# Patient Record
Sex: Female | Born: 1975 | Race: Asian | Hispanic: No | Marital: Married | Smoking: Never smoker
Health system: Southern US, Community
[De-identification: ages and names within clinical notes are randomized; demographics above are authoritative.]

## PROBLEM LIST (undated history)

## (undated) DIAGNOSIS — E119 Type 2 diabetes mellitus without complications: Secondary | ICD-10-CM

## (undated) HISTORY — DX: Type 2 diabetes mellitus without complications: E11.9

---

## 2016-12-15 ENCOUNTER — Encounter (INDEPENDENT_AMBULATORY_CARE_PROVIDER_SITE_OTHER): Payer: Self-pay | Admitting: Orthopaedic Surgery

## 2016-12-15 ENCOUNTER — Ambulatory Visit (INDEPENDENT_AMBULATORY_CARE_PROVIDER_SITE_OTHER): Payer: BLUE CROSS/BLUE SHIELD | Admitting: Orthopaedic Surgery

## 2016-12-15 VITALS — BP 135/85 | HR 111 | Ht 62.0 in | Wt 140.0 lb

## 2016-12-15 DIAGNOSIS — G8929 Other chronic pain: Secondary | ICD-10-CM | POA: Diagnosis not present

## 2016-12-15 DIAGNOSIS — M25561 Pain in right knee: Secondary | ICD-10-CM

## 2016-12-15 NOTE — Progress Notes (Signed)
Office Visit Note   Patient: Donna Gutierrez           Date of Birth: 1976/03/10           MRN: [id number] Visit Date: 12/15/2016              Requested by: No referring provider defined for this encounter. PCP: Karle PlumberArvind, Moogali M, MD   Assessment & Plan: Visit Diagnoses:  1. Chronic pain of right knee   Pain, chronic effusion right knee without history of injury or trauma. [facility] [place] were apparently negative  Plan: Continue Celebrex, MRI scan right knee. Long discussion regarding diagnostic possibilities. This certainly could be some internal derangement i.e. medial meniscal tear. She could have some medial compartment arthritis. She's had symptoms now for over 5 months with poor response to Celebrex  Follow-Up Instructions: Return after MRI right knee.   Orders:  Orders Placed This Encounter  Procedures  . MR Knee Right w/o contrast   No orders of the defined types were placed in this encounter.     Procedures: No procedures performed   Clinical Data: No additional findings.   Subjective: Chief Complaint  Patient presents with  . Right Knee - Pain, Edema    Donna Gutierrez is a 41 y o that presents with R knee pain x 5 months. Denies injury, Pt went to PCP and xr obtained. Celebrex helps, less ROM  Donna Gutierrez is accompanied by her cousin who speaks [place] and interprets. She has had pain and swelling in her right knee for at least 5 months. There is no history of injury or trauma. No history of skin rashes or other joint complaints. Denies fever or chills. She [facility] she [facility] and was placed on Celebrex. After approximately a week and a half there's been no change in her symptoms. She also had x-rays that were apparently negative according to the patient  HPI  Review of Systems   Objective: Vital Signs: BP 135/85   Pulse (!) 111   Ht 5\' 2"  (1.575 m)   Wt 140 lb  (63.5 kg)   BMI 25.61 kg/m   Physical Exam  Ortho Exam right knee with small effusion. The knee wasn't warm. No skin change. Full extension. Flexion was a little limited to about 105 based on the effusion. Mild diffuse medial joint pain no. No popping or clicking. No lateral joint pain. No patella popping or clicking. No opening with a varus or valgus stress. No popliteal pain. No calf pain.  Specialty Comments:  No specialty comments available.  Imaging: No results found.   PMFS History: There are no active problems to display for this patient.  Past Medical History:  Diagnosis Date  . Diabetes mellitus without complication (HCC)     History reviewed. No pertinent family history.  History reviewed. No pertinent surgical history. Social History   Occupational History  . Not on file.   Social History Main Topics  . Smoking status: Never Smoker  . Smokeless tobacco: Never Used  . Alcohol use No  . Drug use: Unknown  . Sexual activity: Not on file     Valeria BatmanPeter W Ronson Hagins, MD   Note - This record has been created using [employer] software.  Chart creation errors have been sought, but may not always  have been located. Such creation errors do not reflect on  the standard of medical care.

## 2016-12-31 ENCOUNTER — Ambulatory Visit
Admission: RE | Admit: 2016-12-31 | Discharge: 2016-12-31 | Disposition: A | Discharge status: Home / Self Care | Payer: BLUE CROSS/BLUE SHIELD | Source: Ambulatory Visit | Attending: Orthopaedic Surgery | Admitting: Orthopaedic Surgery

## 2016-12-31 DIAGNOSIS — M25561 Pain in right knee: Principal | ICD-10-CM

## 2016-12-31 DIAGNOSIS — G8929 Other chronic pain: Secondary | ICD-10-CM

## 2017-01-23 ENCOUNTER — Ambulatory Visit (INDEPENDENT_AMBULATORY_CARE_PROVIDER_SITE_OTHER): Payer: BLUE CROSS/BLUE SHIELD | Admitting: Orthopaedic Surgery

## 2017-01-23 ENCOUNTER — Encounter (INDEPENDENT_AMBULATORY_CARE_PROVIDER_SITE_OTHER): Payer: Self-pay | Admitting: Orthopaedic Surgery

## 2017-01-23 VITALS — Ht 64.0 in | Wt 130.0 lb

## 2017-01-23 DIAGNOSIS — M25561 Pain in right knee: Secondary | ICD-10-CM

## 2017-01-23 DIAGNOSIS — G8929 Other chronic pain: Secondary | ICD-10-CM

## 2017-01-23 NOTE — Progress Notes (Signed)
   Office Visit Note   Patient: Donna Gutierrez           Date of Birth: 09-26-1975           MRN: [id number] Visit Date: 01/23/2017              Requested by: Karle PlumberArvind, Moogali M, MD [id number] [place], [place] [id number] PCP: Karle PlumberArvind, Moogali M, MD   Assessment & Plan: Visit Diagnoses:  1. Chronic pain of right knee   MRI scan right knee reveals a large radial tear of the root of the posterior horn of the medial meniscus. 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 This also a degenerative anterior cruciate ligament but that does not appear to be completely torn. There is mild to moderate degenerative chondral thinning in the medial and lateral compartment and a moderate sized Baker's cyst.  Plan: Long discussion regarding findings by MRI scan. We'll try a pullover knee support. Consider NSAIDs and exercises. In the future consider cortisone injection and may be Visco supplementation. Other possibility includes arthroscopic debridement. No further questions  Follow-Up Instructions: Return if symptoms worsen or fail to improve.   Orders:  No orders of the defined types were placed in this encounter.  No orders of the defined types were placed in this encounter.     Procedures: No procedures performed   Clinical Data: No additional findings.   Subjective: Chief Complaint  Patient presents with  . Right Knee - Pain, Results  No history of injury or trauma. Pain is less intense and less "achy". MRI scan as above. Does work on her feet most of the day and her part-time job was some discomfort  HPI  Review of Systems  Constitutional: Negative for chills, fatigue and fever.  Eyes: Negative for itching.  Respiratory: Negative for chest tightness and shortness of breath.   Cardiovascular: Negative for chest pain, palpitations and leg swelling.  Gastrointestinal: Negative for blood in stool, constipation and diarrhea.  Musculoskeletal: Negative for back pain,  joint swelling, neck pain and neck stiffness.  Neurological: Negative for dizziness, weakness, numbness and headaches.  Hematological: Does not bruise/bleed easily.  Psychiatric/Behavioral: Negative for sleep disturbance. The patient is not nervous/anxious.   All other systems reviewed and are negative.    Objective: Vital Signs: Ht 5\' 4"  (1.626 m)   Wt 130 lb (59 kg)   BMI 22.31 kg/m   Physical Exam  Ortho Exam minimal effusion right knee. No posterior medial joint pain mostly anterior medial joint pain. None laterally. Mild patella crepitation. Negative anterior drawer sign. No obvious instability. No popliteal mass or fullness on today's exam. No calf pain. No distal edema. Neurovascular exam intact. Full extension  Specialty Comments:  No specialty comments available.  Imaging: No results found.   PMFS History: There are no active problems to display for this patient.  Past Medical History:  Diagnosis Date  . Diabetes mellitus without complication (HCC)     History reviewed. No pertinent family history.  History reviewed. No pertinent surgical history. Social History   Occupational History  . Not on file.   Social History Main Topics  . Smoking status: Never Smoker  . Smokeless tobacco: Never Used  . Alcohol use No  . Drug use: Unknown  . Sexual activity: Not on file

## 2018-09-03 IMAGING — MR MR KNEE*R* W/O CM
4 of 6 series · 19 of 40 positions shown · non-contrast
Comparison: None.

CLINICAL DATA: Right knee swelling, pain, and instability over the
past 5 months.

EXAM:
MRI OF THE RIGHT KNEE WITHOUT CONTRAST
TECHNIQUE: Multiplanar, multisequence MR imaging of the knee was performed. No
intravenous contrast was administered.

[Series 3: PD fat-sat · axial · 4.0mm · 0.31mm/px · z∈[-67,+38]mm · 7 of 25 slices shown (1 of 4)]
[im 1/25]
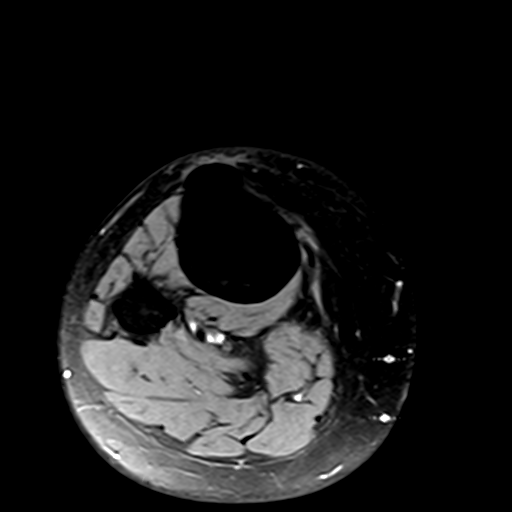
[im 5/25]
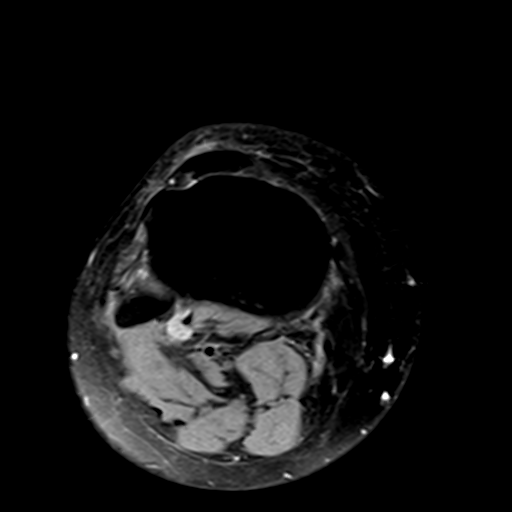
[im 9/25]
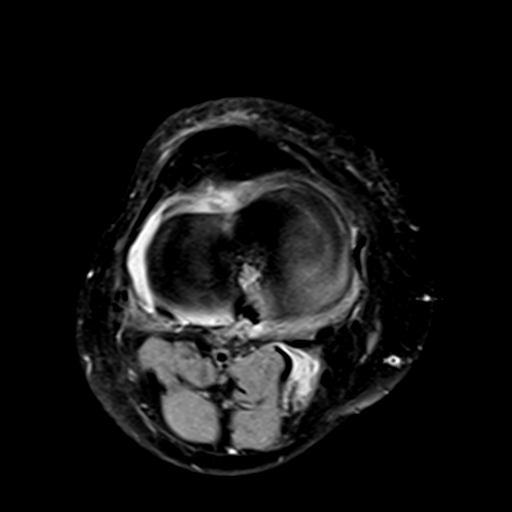
[im 13/25]
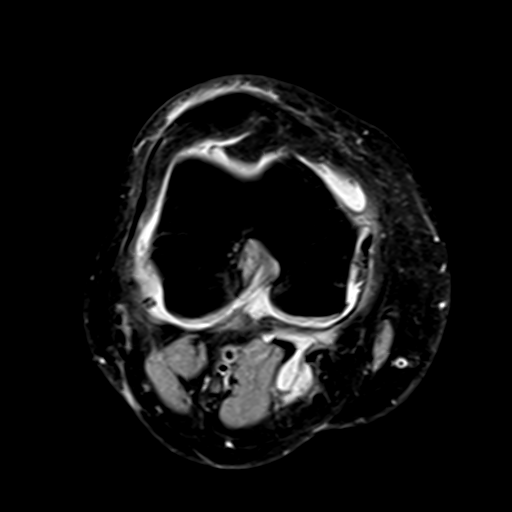
[im 17/25]
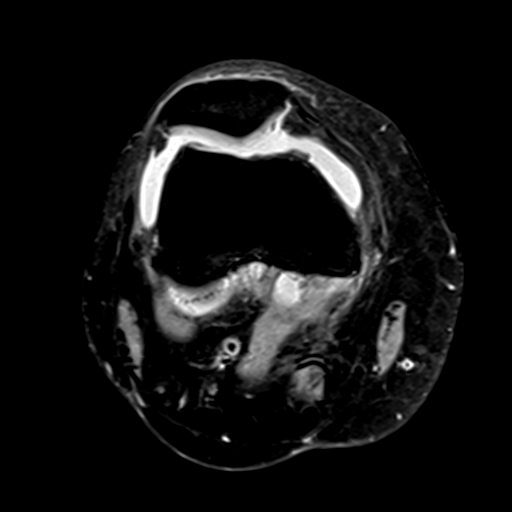
[im 21/25]
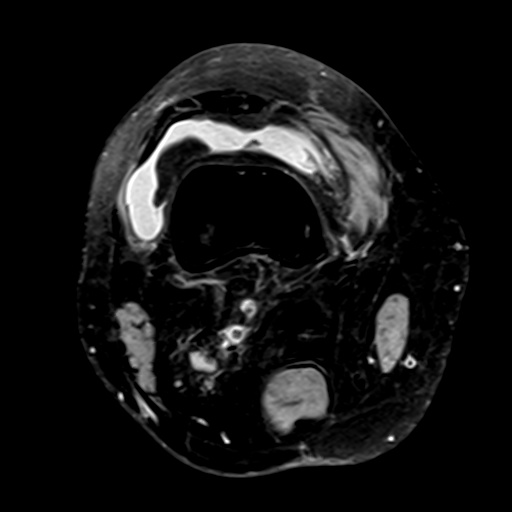
[im 25/25]
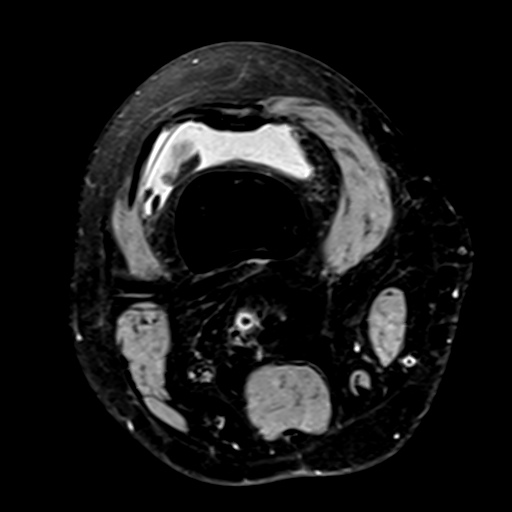

[Series 5: PD fat-sat · sagittal · 3.2mm · 0.31mm/px · 6 of 22 slices shown (2 of 4)]
[im 1/22]
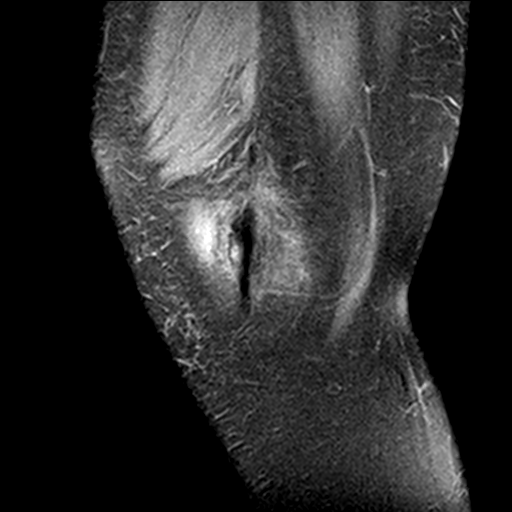
[im 4/22]
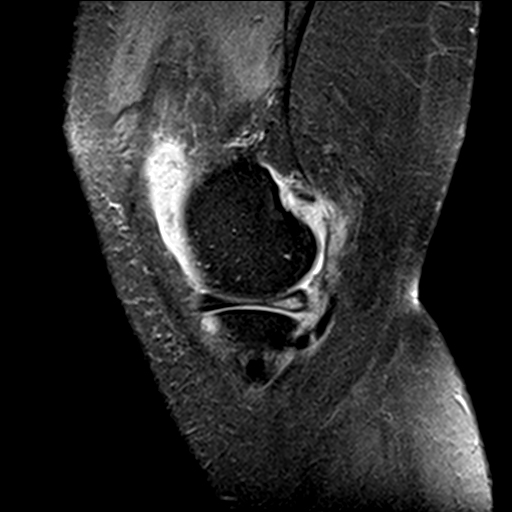
[im 8/22]
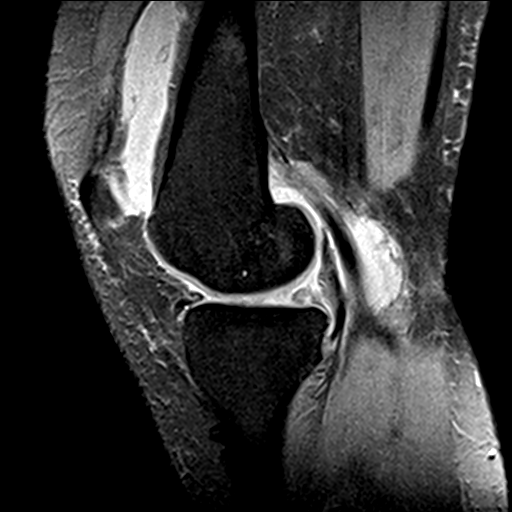
[im 11/22]
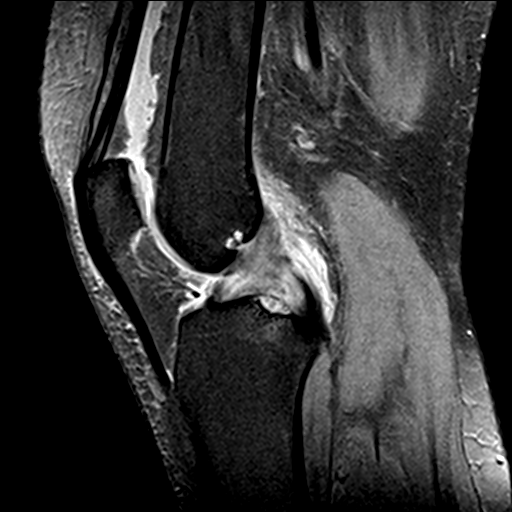
[im 15/22]
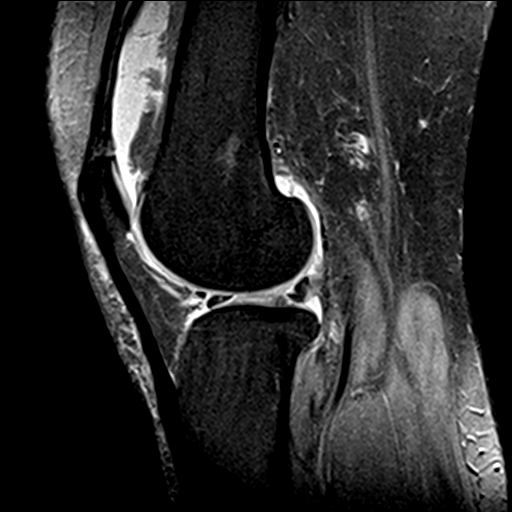
[im 18/22]
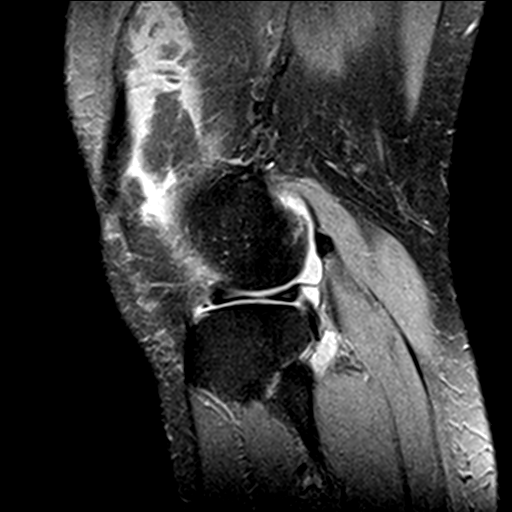

[Series 7: PD fat-sat · coronal · 3.5mm · 0.31mm/px · 3 of 23 slices shown (3 of 4)]
[im 4/23]
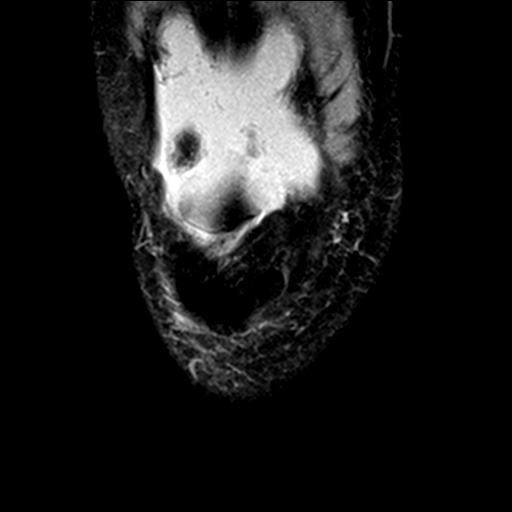
[im 12/23]
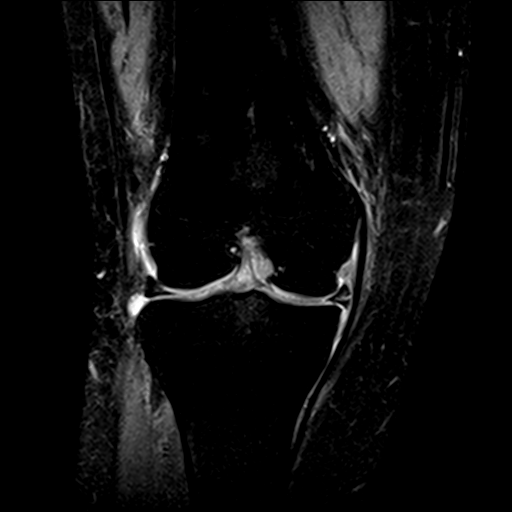
[im 19/23]
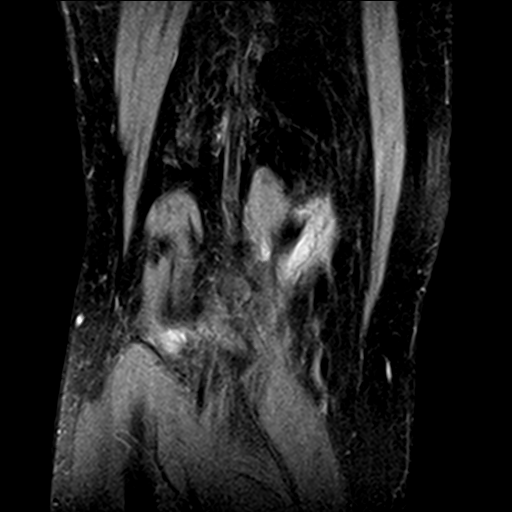

[Series 8: PD fat-sat · coronal · 2.0mm · 0.29mm/px · 3 of 17 slices shown (4 of 4)]
[im 1/17]
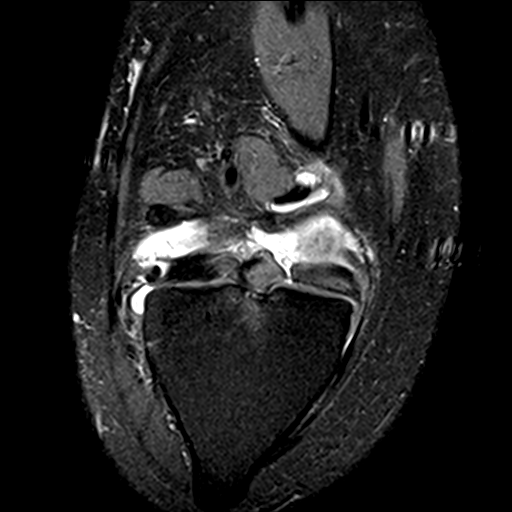
[im 9/17]
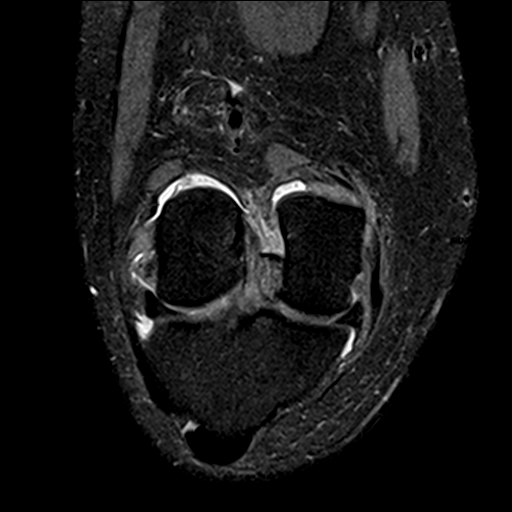
[im 17/17]
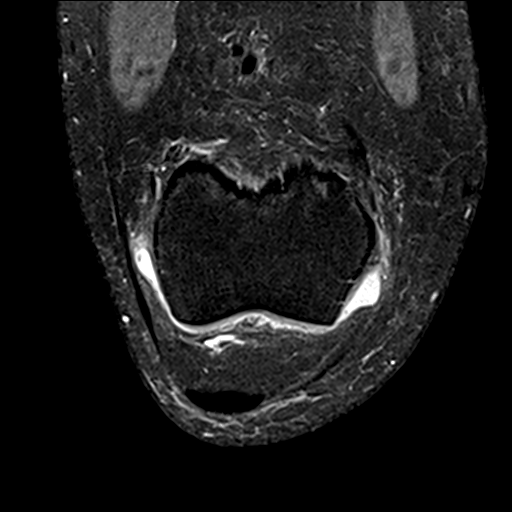

[19 of 40 positions shown; findings below may reference images not displayed]

FINDINGS: MENISCI

Medial meniscus: Large radial tear of the root of the posterior horn
with prominent adjacent grade 1 signal in the rest of the posterior
horn and midbody.

Lateral meniscus: Degenerative tear of the root of the posterior
horn lateral meniscus.

LIGAMENTS

Cruciates: Expanded and highly edematous PCL is either severely
degenerated or torn. Degenerated anterior cruciate ligament noted,
not overtly discontinuous. Both cruciate ligaments or expanded and
indistinct

Collaterals: Thickened proximal MCL with surrounding edema and
potential partial tearing.

CARTILAGE

Patellofemoral:  Mild to moderate degenerative chondral thinning.

Medial:  Moderate degenerative chondral thinning.

Lateral:  Moderate degenerative chondral thinning.

Joint:  Moderate knee effusion with synovitis.

Popliteal Fossa:  Moderate size Baker's cyst.

Extensor Mechanism:  Unremarkable

Bones:  Small geode or erosion along the tibial spine.

Other: No supplemental non-categorized findings.
IMPRESSION: 1. Large radial tear of the root of the posterior horn medial
meniscus.
2. Indistinct degenerative tear of the root of the posterior horn
lateral meniscus.
3. Severely degenerated or torn PCL is edematous and expanded.
4. Degenerated ACL and does not appear completely torn but likewise
appears indistinct and with accentuated T2 signal.
5. Suspected partial tearing of the proximal MCL.
6. Mild to moderate degenerative chondral thinning.
7. Moderate knee effusion with synovitis and a moderate size Baker's
cyst.
8. Small geode or erosion along the tibial spine, image [DATE].

## 2020-02-18 ENCOUNTER — Other Ambulatory Visit: Payer: Self-pay

## 2020-02-18 ENCOUNTER — Other Ambulatory Visit: Payer: BC Managed Care – PPO

## 2020-02-18 DIAGNOSIS — Z20822 Contact with and (suspected) exposure to covid-19: Secondary | ICD-10-CM

## 2020-02-19 LAB — NOVEL CORONAVIRUS, NAA: SARS-CoV-2, NAA: DETECTED — AB

## 2020-07-22 DIAGNOSIS — H338 Other retinal detachments: Secondary | ICD-10-CM | POA: Diagnosis not present

## 2020-07-22 DIAGNOSIS — H40053 Ocular hypertension, bilateral: Secondary | ICD-10-CM | POA: Diagnosis not present

## 2020-07-22 DIAGNOSIS — H25042 Posterior subcapsular polar age-related cataract, left eye: Secondary | ICD-10-CM | POA: Diagnosis not present

## 2020-07-22 DIAGNOSIS — Z961 Presence of intraocular lens: Secondary | ICD-10-CM | POA: Diagnosis not present

## 2020-07-22 DIAGNOSIS — E113511 Type 2 diabetes mellitus with proliferative diabetic retinopathy with macular edema, right eye: Secondary | ICD-10-CM | POA: Diagnosis not present

## 2020-07-22 DIAGNOSIS — H401132 Primary open-angle glaucoma, bilateral, moderate stage: Secondary | ICD-10-CM | POA: Diagnosis not present

## 2020-07-30 DIAGNOSIS — Z794 Long term (current) use of insulin: Secondary | ICD-10-CM | POA: Diagnosis not present

## 2020-07-30 DIAGNOSIS — E113553 Type 2 diabetes mellitus with stable proliferative diabetic retinopathy, bilateral: Secondary | ICD-10-CM | POA: Diagnosis not present

## 2020-07-30 DIAGNOSIS — E7800 Pure hypercholesterolemia, unspecified: Secondary | ICD-10-CM | POA: Diagnosis not present

## 2020-10-06 DIAGNOSIS — H43392 Other vitreous opacities, left eye: Secondary | ICD-10-CM | POA: Diagnosis not present

## 2020-10-06 DIAGNOSIS — H338 Other retinal detachments: Secondary | ICD-10-CM | POA: Diagnosis not present

## 2020-10-06 DIAGNOSIS — H401132 Primary open-angle glaucoma, bilateral, moderate stage: Secondary | ICD-10-CM | POA: Diagnosis not present

## 2020-10-06 DIAGNOSIS — H26492 Other secondary cataract, left eye: Secondary | ICD-10-CM | POA: Diagnosis not present

## 2020-10-06 DIAGNOSIS — H35372 Puckering of macula, left eye: Secondary | ICD-10-CM | POA: Diagnosis not present

## 2020-10-06 DIAGNOSIS — Z961 Presence of intraocular lens: Secondary | ICD-10-CM | POA: Diagnosis not present

## 2020-10-13 DIAGNOSIS — H43392 Other vitreous opacities, left eye: Secondary | ICD-10-CM | POA: Diagnosis not present

## 2020-10-21 DIAGNOSIS — E113591 Type 2 diabetes mellitus with proliferative diabetic retinopathy without macular edema, right eye: Secondary | ICD-10-CM | POA: Diagnosis not present

## 2020-10-21 DIAGNOSIS — H401122 Primary open-angle glaucoma, left eye, moderate stage: Secondary | ICD-10-CM | POA: Diagnosis not present

## 2020-10-21 DIAGNOSIS — H40052 Ocular hypertension, left eye: Secondary | ICD-10-CM | POA: Diagnosis not present

## 2020-10-27 DIAGNOSIS — E113553 Type 2 diabetes mellitus with stable proliferative diabetic retinopathy, bilateral: Secondary | ICD-10-CM | POA: Diagnosis not present

## 2020-10-27 DIAGNOSIS — E7800 Pure hypercholesterolemia, unspecified: Secondary | ICD-10-CM | POA: Diagnosis not present

## 2020-10-27 DIAGNOSIS — Z794 Long term (current) use of insulin: Secondary | ICD-10-CM | POA: Diagnosis not present

## 2020-11-11 DIAGNOSIS — E113591 Type 2 diabetes mellitus with proliferative diabetic retinopathy without macular edema, right eye: Secondary | ICD-10-CM | POA: Diagnosis not present

## 2020-11-11 DIAGNOSIS — H3582 Retinal ischemia: Secondary | ICD-10-CM | POA: Diagnosis not present

## 2020-11-11 DIAGNOSIS — H35372 Puckering of macula, left eye: Secondary | ICD-10-CM | POA: Diagnosis not present

## 2020-11-11 DIAGNOSIS — H25811 Combined forms of age-related cataract, right eye: Secondary | ICD-10-CM | POA: Diagnosis not present

## 2020-11-11 DIAGNOSIS — H59812 Chorioretinal scars after surgery for detachment, left eye: Secondary | ICD-10-CM | POA: Diagnosis not present

## 2020-11-16 DIAGNOSIS — Z20822 Contact with and (suspected) exposure to covid-19: Secondary | ICD-10-CM | POA: Diagnosis not present

## 2020-11-17 DIAGNOSIS — Z20822 Contact with and (suspected) exposure to covid-19: Secondary | ICD-10-CM | POA: Diagnosis not present

## 2021-01-27 DIAGNOSIS — Z794 Long term (current) use of insulin: Secondary | ICD-10-CM | POA: Diagnosis not present

## 2021-01-27 DIAGNOSIS — E113553 Type 2 diabetes mellitus with stable proliferative diabetic retinopathy, bilateral: Secondary | ICD-10-CM | POA: Diagnosis not present

## 2021-01-27 DIAGNOSIS — E7800 Pure hypercholesterolemia, unspecified: Secondary | ICD-10-CM | POA: Diagnosis not present

## 2021-02-10 DIAGNOSIS — H35372 Puckering of macula, left eye: Secondary | ICD-10-CM | POA: Diagnosis not present

## 2021-02-10 DIAGNOSIS — H59812 Chorioretinal scars after surgery for detachment, left eye: Secondary | ICD-10-CM | POA: Diagnosis not present

## 2021-02-10 DIAGNOSIS — H3582 Retinal ischemia: Secondary | ICD-10-CM | POA: Diagnosis not present

## 2021-02-10 DIAGNOSIS — H11432 Conjunctival hyperemia, left eye: Secondary | ICD-10-CM | POA: Diagnosis not present

## 2021-02-10 DIAGNOSIS — H10012 Acute follicular conjunctivitis, left eye: Secondary | ICD-10-CM | POA: Diagnosis not present

## 2021-02-10 DIAGNOSIS — E113591 Type 2 diabetes mellitus with proliferative diabetic retinopathy without macular edema, right eye: Secondary | ICD-10-CM | POA: Diagnosis not present

## 2021-03-15 DIAGNOSIS — H401132 Primary open-angle glaucoma, bilateral, moderate stage: Secondary | ICD-10-CM | POA: Diagnosis not present

## 2021-03-15 DIAGNOSIS — E113593 Type 2 diabetes mellitus with proliferative diabetic retinopathy without macular edema, bilateral: Secondary | ICD-10-CM | POA: Diagnosis not present

## 2021-03-15 DIAGNOSIS — H3582 Retinal ischemia: Secondary | ICD-10-CM | POA: Diagnosis not present

## 2021-03-15 DIAGNOSIS — H338 Other retinal detachments: Secondary | ICD-10-CM | POA: Diagnosis not present

## 2021-03-15 DIAGNOSIS — H59812 Chorioretinal scars after surgery for detachment, left eye: Secondary | ICD-10-CM | POA: Diagnosis not present

## 2021-03-15 DIAGNOSIS — H40053 Ocular hypertension, bilateral: Secondary | ICD-10-CM | POA: Diagnosis not present

## 2021-06-17 DIAGNOSIS — H401122 Primary open-angle glaucoma, left eye, moderate stage: Secondary | ICD-10-CM | POA: Diagnosis not present

## 2021-06-17 DIAGNOSIS — H338 Other retinal detachments: Secondary | ICD-10-CM | POA: Diagnosis not present

## 2021-06-17 DIAGNOSIS — Z961 Presence of intraocular lens: Secondary | ICD-10-CM | POA: Diagnosis not present

## 2021-06-17 DIAGNOSIS — H40052 Ocular hypertension, left eye: Secondary | ICD-10-CM | POA: Diagnosis not present

## 2021-06-17 DIAGNOSIS — H59812 Chorioretinal scars after surgery for detachment, left eye: Secondary | ICD-10-CM | POA: Diagnosis not present

## 2021-06-17 DIAGNOSIS — H35372 Puckering of macula, left eye: Secondary | ICD-10-CM | POA: Diagnosis not present

## 2021-06-29 DIAGNOSIS — Z7984 Long term (current) use of oral hypoglycemic drugs: Secondary | ICD-10-CM | POA: Diagnosis not present

## 2021-06-29 DIAGNOSIS — E113553 Type 2 diabetes mellitus with stable proliferative diabetic retinopathy, bilateral: Secondary | ICD-10-CM | POA: Diagnosis not present

## 2021-06-29 DIAGNOSIS — Z794 Long term (current) use of insulin: Secondary | ICD-10-CM | POA: Diagnosis not present

## 2021-06-29 DIAGNOSIS — E7800 Pure hypercholesterolemia, unspecified: Secondary | ICD-10-CM | POA: Diagnosis not present

## 2021-07-22 DIAGNOSIS — H401122 Primary open-angle glaucoma, left eye, moderate stage: Secondary | ICD-10-CM | POA: Diagnosis not present

## 2021-07-22 DIAGNOSIS — H59812 Chorioretinal scars after surgery for detachment, left eye: Secondary | ICD-10-CM | POA: Diagnosis not present

## 2021-07-22 DIAGNOSIS — H35372 Puckering of macula, left eye: Secondary | ICD-10-CM | POA: Diagnosis not present

## 2021-07-22 DIAGNOSIS — H338 Other retinal detachments: Secondary | ICD-10-CM | POA: Diagnosis not present

## 2021-07-22 DIAGNOSIS — H40051 Ocular hypertension, right eye: Secondary | ICD-10-CM | POA: Diagnosis not present

## 2021-08-25 DIAGNOSIS — H338 Other retinal detachments: Secondary | ICD-10-CM | POA: Diagnosis not present

## 2021-08-25 DIAGNOSIS — H59812 Chorioretinal scars after surgery for detachment, left eye: Secondary | ICD-10-CM | POA: Diagnosis not present

## 2021-08-25 DIAGNOSIS — Z961 Presence of intraocular lens: Secondary | ICD-10-CM | POA: Diagnosis not present

## 2021-08-25 DIAGNOSIS — H35372 Puckering of macula, left eye: Secondary | ICD-10-CM | POA: Diagnosis not present

## 2021-08-25 DIAGNOSIS — H40052 Ocular hypertension, left eye: Secondary | ICD-10-CM | POA: Diagnosis not present

## 2021-09-27 DIAGNOSIS — Z7984 Long term (current) use of oral hypoglycemic drugs: Secondary | ICD-10-CM | POA: Diagnosis not present

## 2021-09-27 DIAGNOSIS — E7800 Pure hypercholesterolemia, unspecified: Secondary | ICD-10-CM | POA: Diagnosis not present

## 2021-09-27 DIAGNOSIS — E113553 Type 2 diabetes mellitus with stable proliferative diabetic retinopathy, bilateral: Secondary | ICD-10-CM | POA: Diagnosis not present

## 2021-09-27 DIAGNOSIS — Z794 Long term (current) use of insulin: Secondary | ICD-10-CM | POA: Diagnosis not present

## 2021-11-16 DIAGNOSIS — H40052 Ocular hypertension, left eye: Secondary | ICD-10-CM | POA: Diagnosis not present

## 2021-11-16 DIAGNOSIS — H59812 Chorioretinal scars after surgery for detachment, left eye: Secondary | ICD-10-CM | POA: Diagnosis not present

## 2021-11-16 DIAGNOSIS — H338 Other retinal detachments: Secondary | ICD-10-CM | POA: Diagnosis not present

## 2021-11-16 DIAGNOSIS — H35372 Puckering of macula, left eye: Secondary | ICD-10-CM | POA: Diagnosis not present

## 2021-11-16 DIAGNOSIS — H25811 Combined forms of age-related cataract, right eye: Secondary | ICD-10-CM | POA: Diagnosis not present

## 2021-11-16 DIAGNOSIS — Z961 Presence of intraocular lens: Secondary | ICD-10-CM | POA: Diagnosis not present

## 2022-01-31 DIAGNOSIS — Z794 Long term (current) use of insulin: Secondary | ICD-10-CM | POA: Diagnosis not present

## 2022-01-31 DIAGNOSIS — Z7984 Long term (current) use of oral hypoglycemic drugs: Secondary | ICD-10-CM | POA: Diagnosis not present

## 2022-01-31 DIAGNOSIS — E113553 Type 2 diabetes mellitus with stable proliferative diabetic retinopathy, bilateral: Secondary | ICD-10-CM | POA: Diagnosis not present

## 2022-01-31 DIAGNOSIS — E7800 Pure hypercholesterolemia, unspecified: Secondary | ICD-10-CM | POA: Diagnosis not present

## 2022-01-31 DIAGNOSIS — E1165 Type 2 diabetes mellitus with hyperglycemia: Secondary | ICD-10-CM | POA: Diagnosis not present

## 2022-02-22 DIAGNOSIS — H35372 Puckering of macula, left eye: Secondary | ICD-10-CM | POA: Diagnosis not present

## 2022-02-22 DIAGNOSIS — H401132 Primary open-angle glaucoma, bilateral, moderate stage: Secondary | ICD-10-CM | POA: Diagnosis not present

## 2022-02-22 DIAGNOSIS — Z961 Presence of intraocular lens: Secondary | ICD-10-CM | POA: Diagnosis not present

## 2022-02-22 DIAGNOSIS — H338 Other retinal detachments: Secondary | ICD-10-CM | POA: Diagnosis not present

## 2022-02-22 DIAGNOSIS — H40052 Ocular hypertension, left eye: Secondary | ICD-10-CM | POA: Diagnosis not present

## 2022-06-14 DIAGNOSIS — H338 Other retinal detachments: Secondary | ICD-10-CM | POA: Diagnosis not present

## 2022-06-14 DIAGNOSIS — E113591 Type 2 diabetes mellitus with proliferative diabetic retinopathy without macular edema, right eye: Secondary | ICD-10-CM | POA: Diagnosis not present

## 2022-06-14 DIAGNOSIS — H35372 Puckering of macula, left eye: Secondary | ICD-10-CM | POA: Diagnosis not present

## 2022-06-14 DIAGNOSIS — H3561 Retinal hemorrhage, right eye: Secondary | ICD-10-CM | POA: Diagnosis not present

## 2022-06-14 DIAGNOSIS — H59812 Chorioretinal scars after surgery for detachment, left eye: Secondary | ICD-10-CM | POA: Diagnosis not present

## 2022-08-29 DIAGNOSIS — H35372 Puckering of macula, left eye: Secondary | ICD-10-CM | POA: Diagnosis not present

## 2022-08-29 DIAGNOSIS — H31093 Other chorioretinal scars, bilateral: Secondary | ICD-10-CM | POA: Diagnosis not present

## 2022-08-29 DIAGNOSIS — H59812 Chorioretinal scars after surgery for detachment, left eye: Secondary | ICD-10-CM | POA: Diagnosis not present

## 2022-08-29 DIAGNOSIS — E113591 Type 2 diabetes mellitus with proliferative diabetic retinopathy without macular edema, right eye: Secondary | ICD-10-CM | POA: Diagnosis not present

## 2022-08-29 DIAGNOSIS — H338 Other retinal detachments: Secondary | ICD-10-CM | POA: Diagnosis not present

## 2022-09-28 DIAGNOSIS — E782 Mixed hyperlipidemia: Secondary | ICD-10-CM | POA: Diagnosis not present

## 2022-09-28 DIAGNOSIS — E113553 Type 2 diabetes mellitus with stable proliferative diabetic retinopathy, bilateral: Secondary | ICD-10-CM | POA: Diagnosis not present

## 2022-09-28 DIAGNOSIS — Z794 Long term (current) use of insulin: Secondary | ICD-10-CM | POA: Diagnosis not present

## 2022-09-28 DIAGNOSIS — Z7984 Long term (current) use of oral hypoglycemic drugs: Secondary | ICD-10-CM | POA: Diagnosis not present

## 2022-09-30 DIAGNOSIS — E782 Mixed hyperlipidemia: Secondary | ICD-10-CM | POA: Diagnosis not present

## 2022-09-30 DIAGNOSIS — E113553 Type 2 diabetes mellitus with stable proliferative diabetic retinopathy, bilateral: Secondary | ICD-10-CM | POA: Diagnosis not present

## 2022-09-30 DIAGNOSIS — Z794 Long term (current) use of insulin: Secondary | ICD-10-CM | POA: Diagnosis not present
